# Patient Record
Sex: Male | Born: 1980 | Race: White | Hispanic: No | Marital: Single | Smoking: Current every day smoker
Health system: Southern US, Community
[De-identification: ages and names within clinical notes are randomized; demographics above are authoritative.]

## PROBLEM LIST (undated history)

## (undated) HISTORY — PX: KNEE ARTHROSCOPY: SUR90

---

## 2017-11-15 ENCOUNTER — Emergency Department (HOSPITAL_COMMUNITY)
Admission: EM | Admit: 2017-11-15 | Discharge: 2017-11-15 | Disposition: A | Discharge status: Home / Self Care | Payer: Self-pay | Attending: Emergency Medicine | Admitting: Emergency Medicine

## 2017-11-15 ENCOUNTER — Emergency Department (HOSPITAL_COMMUNITY): Payer: Self-pay

## 2017-11-15 ENCOUNTER — Encounter (HOSPITAL_COMMUNITY): Payer: Self-pay | Admitting: *Deleted

## 2017-11-15 ENCOUNTER — Other Ambulatory Visit: Payer: Self-pay

## 2017-11-15 DIAGNOSIS — R1032 Left lower quadrant pain: Secondary | ICD-10-CM | POA: Insufficient documentation

## 2017-11-15 DIAGNOSIS — I861 Scrotal varices: Secondary | ICD-10-CM | POA: Insufficient documentation

## 2017-11-15 DIAGNOSIS — F17200 Nicotine dependence, unspecified, uncomplicated: Secondary | ICD-10-CM | POA: Insufficient documentation

## 2017-11-15 LAB — CBC WITH DIFFERENTIAL/PLATELET
BASOS PCT: 1 %
Basophils Absolute: 0 10*3/uL (ref 0.0–0.1)
Eosinophils Absolute: 0.5 10*3/uL (ref 0.0–0.7)
Eosinophils Relative: 7 %
HEMATOCRIT: 41.9 % (ref 39.0–52.0)
HEMOGLOBIN: 14 g/dL (ref 13.0–17.0)
LYMPHS ABS: 2.5 10*3/uL (ref 0.7–4.0)
Lymphocytes Relative: 37 %
MCH: 29.9 pg (ref 26.0–34.0)
MCHC: 33.4 g/dL (ref 30.0–36.0)
MCV: 89.5 fL (ref 78.0–100.0)
MONOS PCT: 12 %
Monocytes Absolute: 0.8 10*3/uL (ref 0.1–1.0)
NEUTROS ABS: 3 10*3/uL (ref 1.7–7.7)
NEUTROS PCT: 43 %
Platelets: 138 10*3/uL — ABNORMAL LOW (ref 150–400)
RBC: 4.68 MIL/uL (ref 4.22–5.81)
RDW: 13.4 % (ref 11.5–15.5)
WBC: 6.8 10*3/uL (ref 4.0–10.5)

## 2017-11-15 LAB — URINALYSIS, ROUTINE W REFLEX MICROSCOPIC
Bilirubin Urine: NEGATIVE
Glucose, UA: NEGATIVE mg/dL
Hgb urine dipstick: NEGATIVE
KETONES UR: NEGATIVE mg/dL
Leukocytes, UA: NEGATIVE
Nitrite: NEGATIVE
PH: 7 (ref 5.0–8.0)
Protein, ur: NEGATIVE mg/dL
SPECIFIC GRAVITY, URINE: 1.019 (ref 1.005–1.030)
SQUAMOUS EPITHELIAL / LPF: NONE SEEN

## 2017-11-15 LAB — BASIC METABOLIC PANEL
Anion gap: 11 (ref 5–15)
BUN: 10 mg/dL (ref 6–20)
CHLORIDE: 105 mmol/L (ref 101–111)
CO2: 24 mmol/L (ref 22–32)
CREATININE: 0.96 mg/dL (ref 0.61–1.24)
Calcium: 9.5 mg/dL (ref 8.9–10.3)
GFR calc non Af Amer: >60 mL/min (ref >60)
Glucose, Bld: 80 mg/dL (ref 65–99)
Potassium: 3.8 mmol/L (ref 3.5–5.1)
Sodium: 140 mmol/L (ref 135–145)

## 2017-11-15 MED ORDER — ONDANSETRON HCL 4 MG/2ML IJ SOLN
4.0000 mg | Freq: Once | INTRAMUSCULAR | Status: AC
  Administered 2017-11-15: 4 mg via INTRAVENOUS
  Filled 2017-11-15: qty 2

## 2017-11-15 MED ORDER — MORPHINE SULFATE (PF) 4 MG/ML IV SOLN
8.0000 mg | Freq: Once | INTRAVENOUS | Status: AC
Start: 2017-11-15 — End: 2017-11-15
  Administered 2017-11-15: 8 mg via INTRAVENOUS
  Filled 2017-11-15: qty 2

## 2017-11-15 MED ORDER — KETOROLAC TROMETHAMINE 30 MG/ML IJ SOLN
30.0000 mg | Freq: Once | INTRAMUSCULAR | Status: AC
  Administered 2017-11-15: 30 mg via INTRAVENOUS
  Filled 2017-11-15: qty 1

## 2017-11-15 MED ORDER — HYDROCODONE-ACETAMINOPHEN 5-325 MG PO TABS: 1.0000 | ORAL_TABLET | Freq: Four times a day (QID) | ORAL | 0 refills | Status: AC | PRN

## 2017-11-15 MED ORDER — MORPHINE SULFATE (PF) 4 MG/ML IV SOLN: 4.0000 mg | Freq: Once | INTRAVENOUS | Status: DC

## 2017-11-15 NOTE — ED Triage Notes (Addendum)
Pt c/o left testicular pain, swelling and "drawing up" and LLQ abdomen x 3 days. Pt reports straining to urinate. Denies hematuria, vomiting. Reports nausea last night, none at this time. Pt also reports pain radiating to left flank.

## 2017-11-15 NOTE — ED Provider Notes (Signed)
[facility] EMERGENCY DEPARTMENT Provider Note   CSN: [id number] Arrival date & time: 11/15/17  1653     History   Chief Complaint Chief Complaint  Patient presents with  . Testicle Pain    HPI Troy PerfectJonathan Murphy is a 36 y.o. male.  The history is provided by the patient.  Testicle Pain  This is a new problem. The current episode started more than 2 days ago. The problem occurs hourly. The problem has been gradually worsening. Associated symptoms include abdominal pain. Nothing aggravates the symptoms. Relieved by: pushing left testicle upwards. He has tried nothing for the symptoms.   36 year old male who presents with 3 days of left-sided testicular pain followed by left lower quadrant and left flank pain.  Denies any nausea, vomiting, dysuria or urinary frequency, or hematuria.  No fevers or chills.  No diarrhea.  Has not had similar symptoms in the past.  Does feel that his left testicle is riding higher than the right.  Initially had some mild swelling the first day of the left testicle, but now resolved.  History reviewed. No pertinent past medical history.  There are no active problems to display for this patient.   Past Surgical History:  Procedure Laterality Date  . KNEE ARTHROSCOPY Bilateral        Home Medications    Prior to Admission medications   Medication Sig Start Date End Date Taking? Authorizing Provider  HYDROcodone-acetaminophen (NORCO/VICODIN) 5-325 MG tablet Take 1 tablet by mouth every 6 (six) hours as needed. 11/15/17   Lavera GuiseLiu, Jacquline Terrill Duo, MD    Family History No family history on file.  Social History Social History   Tobacco Use  . Smoking status: Current Every Day Smoker  . Smokeless tobacco: Never Used  Substance Use Topics  . Alcohol use: No    Frequency: Never  . 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   Review of Systems Review of Systems  Constitutional: Negative  for fever.  Gastrointestinal: Positive for abdominal pain.  Genitourinary: Positive for difficulty urinating, flank pain, scrotal swelling and testicular pain. Negative for dysuria and frequency.  All other systems reviewed and are negative.    Physical Exam Updated Vital Signs BP (!) 142/106 (BP Location: Right Arm)   Pulse (!) 132   Temp 98.5 F (36.9 C) (Oral)   Resp 19   Ht 6' (1.829 m)   Wt 79.4 kg (175 lb)   SpO2 94%   BMI 23.73 kg/m   Physical Exam Physical Exam  Nursing note and vitals reviewed. Constitutional: Well developed, well nourished, non-toxic, and in no acute distress Head: Normocephalic and atraumatic.  Mouth/Throat: Oropharynx is clear and moist.  Neck: Normal range of motion. Neck supple.  Cardiovascular: Normal rate and regular rhythm.   Pulmonary/Chest: Effort normal and breath sounds normal.  Abdominal: Soft. There is no rebound and no guarding. Mild left CVA and left lower quadrant tenderness GU: Difficult exam due to poor patient cooperation. No erythema, warmth or swelling of scrotum or testicles. No palpable masses. Tenderness of the left epididymis. Normal testicular lie but left testicle sits higher than the right.  Normal cremasteric reflexes Musculoskeletal: Normal range of motion.  Neurological: Alert, no facial droop, fluent speech, moves all extremities symmetrically Skin: Skin is warm and dry.  Psychiatric: Cooperative   ED Treatments / Results  Labs (all labs ordered are listed, but only abnormal results are  displayed) Labs Reviewed  URINALYSIS, ROUTINE W REFLEX MICROSCOPIC - Abnormal; Notable for the following components:      Result Value   Bacteria, UA RARE (*)    All other components within normal limits  CBC WITH DIFFERENTIAL/PLATELET - Abnormal; Notable for the following components:   Platelets 138 (*)    All other components within normal limits  URINE CULTURE  BASIC METABOLIC PANEL  GC/CHLAMYDIA PROBE AMP ([place]) NOT  [facility]    EKG  EKG Interpretation None       Radiology [place] Scrotum  Result Date: 11/15/2017 CLINICAL DATA:  Three day history of left testicular pain and swelling. EXAM: ULTRASOUND OF SCROTUM TECHNIQUE: Complete ultrasound examination of the testicles, epididymis, and other scrotal structures was performed. COMPARISON:  None. FINDINGS: Right testicle Measurements: 4.4 x 3.3 x 2.5 cm. No mass or microlithiasis visualized. Left testicle Measurements: 4.7 x 2.3 x 3.0 cm. No mass or microlithiasis visualized. Right epididymis:  Normal in size and appearance. Left epididymis:  Tiny spermatocele or epididymal cyst. Hydrocele:  None visualized. Varicocele: Venous channels in the left hemiscrotum are distended up to 5-6 mm, compatible with varicocele. IMPRESSION: 1. No evidence for testicular torsion. 2. Left-sided varicocele. Electronically Signed   By: Kennith CenterEric  Mansell M.D.   On: 11/15/2017 18:04   [place] Art/ven Flow Abd Pelv Doppler  Result Date: 11/15/2017 CLINICAL DATA:  Three day history of left testicular pain and swelling. EXAM: ULTRASOUND OF SCROTUM TECHNIQUE: Complete ultrasound examination of the testicles, epididymis, and other scrotal structures was performed. COMPARISON:  None. FINDINGS: Right testicle Measurements: 4.4 x 3.3 x 2.5 cm. No mass or microlithiasis visualized. Left testicle Measurements: 4.7 x 2.3 x 3.0 cm. No mass or microlithiasis visualized. Right epididymis:  Normal in size and appearance. Left epididymis:  Tiny spermatocele or epididymal cyst. Hydrocele:  None visualized. Varicocele: Venous channels in the left hemiscrotum are distended up to 5-6 mm, compatible with varicocele. IMPRESSION: 1. No evidence for testicular torsion. 2. Left-sided varicocele. Electronically Signed   By: Kennith CenterEric  Mansell M.D.   On: 11/15/2017 18:04   Ct Renal Stone Study  Result Date: 11/15/2017 CLINICAL DATA:  Testicular pain left-sided and left lower quadrant pain EXAM: CT ABDOMEN AND PELVIS WITHOUT  CONTRAST TECHNIQUE: Multidetector CT imaging of the abdomen and pelvis was performed following the standard protocol without IV contrast. COMPARISON:  Ultrasound 11/15/2017 FINDINGS: Lower chest: No acute abnormality. Hepatobiliary: No focal liver abnormality is seen. No gallstones, gallbladder wall thickening, or biliary dilatation. Pancreas: Unremarkable. No pancreatic ductal dilatation or surrounding inflammatory changes. Spleen: Normal in size without focal abnormality. Adrenals/Urinary Tract: Right adrenal gland is normal. 2 cm fat density left adrenal mass. Kidneys show no hydronephrosis. 4 mm stone mid to upper pole left kidney. No ureteral stone. Bladder unremarkable Stomach/Bowel: Stomach is within normal limits. Appendix appears normal. No evidence of bowel wall thickening, distention, or inflammatory changes. Vascular/Lymphatic: No significant vascular findings are present. No enlarged abdominal or pelvic lymph nodes. Reproductive: Prostate is unremarkable. Other: Negative for free air or free fluid. Small fat in the umbilicus. Musculoskeletal: No acute or significant osseous findings. IMPRESSION: 1. Negative for hydronephrosis or ureteral stone. 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   On: 11/15/2017 19:16    Procedures Procedures (including critical care time)  Medications Ordered in ED Medications  ketorolac (TORADOL) 30 MG/ML injection 30 mg (30 mg Intravenous Given 11/15/17  1825)  ondansetron (ZOFRAN) injection 4 mg (4 mg Intravenous Given 11/15/17 1825)  morphine 4 MG/ML injection 8 mg (8 mg Intravenous Given 11/15/17 2038)     Initial Impression / Assessment and Plan / ED Course  I have reviewed the triage vital signs and the nursing notes.  Pertinent labs & imaging results that were available during my care of the patient were reviewed by me and considered in my medical decision making (see  chart for details).     Presenting with left lower quadrant, left testicular, left flank pain.  Patient is nontoxic but does appear uncomfortable secondary to pain.  Soft and nonsurgical abdomen.  Differential does include potential testicular/scrotal pathology such as epididymitis versus torsion.  Differential also includes potential renal stone disease.  Ultrasound was performed with Doppler of the scrotum.  This is visualized.  No evidence of torsion, mass , or inflammation.  There is evidence of large varicocele.  This could be causing some of his symptoms.  Renal stone CT was also performed to rule out nephrolithiasis/urolithiasis.  He has nonobstructing 4 mm kidney stone, but no other acute intra-abdominal pathology.  Patient did receive pain control, and feels improved.  UA is unremarkable.  Will discharge home with urology follow-up.  [place] CSRS reviewed. No previous record.  Strict return and follow-up instructions reviewed. He expressed understanding of all discharge instructions and felt comfortable with the plan of care.   Final Clinical Impressions(s) / ED Diagnoses   Final diagnoses:  Varicocele    ED Discharge Orders        Ordered    HYDROcodone-acetaminophen (NORCO/VICODIN) 5-325 MG tablet  Every 6 hours PRN     11/15/17 2304       Lavera Guise, MD 11/15/17 2309

## 2017-11-15 NOTE — Discharge Instructions (Addendum)
Please follow-up with urology if you having ongoing pain with your varicocele. Continue ibuprofen for pain and take Norco for breakthrough pain. Return for worsening symptoms, including fever, escalating pain, intractable vomiting or any other symptoms concerning to you.  You did receive morphine for pain control today in the ED

## 2017-11-15 NOTE — ED Notes (Signed)
EDP notified of pt's symptoms 

## 2017-11-17 LAB — URINE CULTURE: Culture: <10000 — AB

## 2019-03-05 IMAGING — CT CT RENAL STONE PROTOCOL
2 of 4 series · 16 of 46 positions shown, 18 images · non-contrast
Comparison: Ultrasound 11/15/2017

CLINICAL DATA: Testicular pain left-sided and left lower quadrant
pain

EXAM:
CT ABDOMEN AND PELVIS WITHOUT CONTRAST
TECHNIQUE: Multidetector CT imaging of the abdomen and pelvis was performed
following the standard protocol without IV contrast.

[Series 2: axial st · axial · 0.76mm/px · z∈[-612,-132]mm · 13 of 106 slices shown, 15 images]
[im 5/106  soft-tissue]
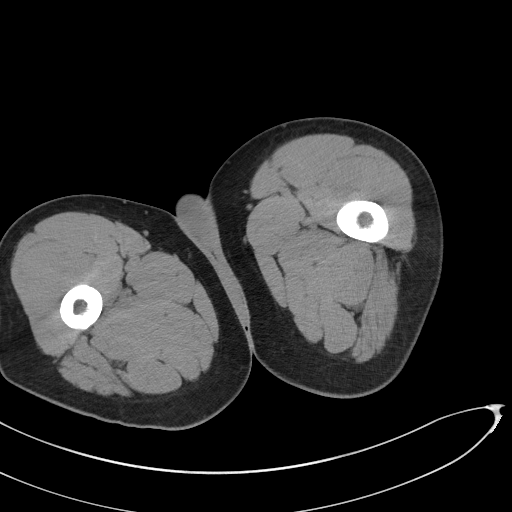
[im 5/106  bone]
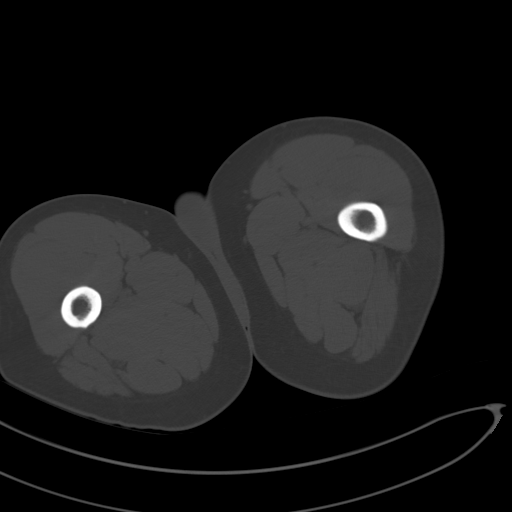
[im 14/106  soft-tissue]
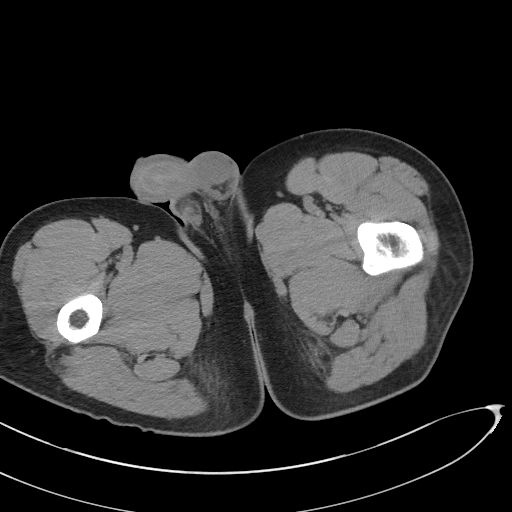
[im 22/106  soft-tissue]
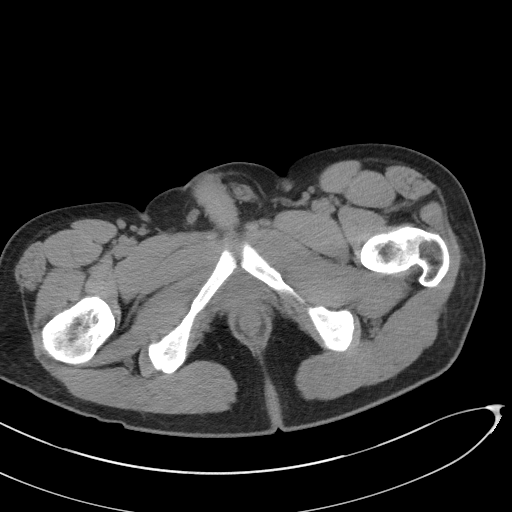
[im 31/106  soft-tissue]
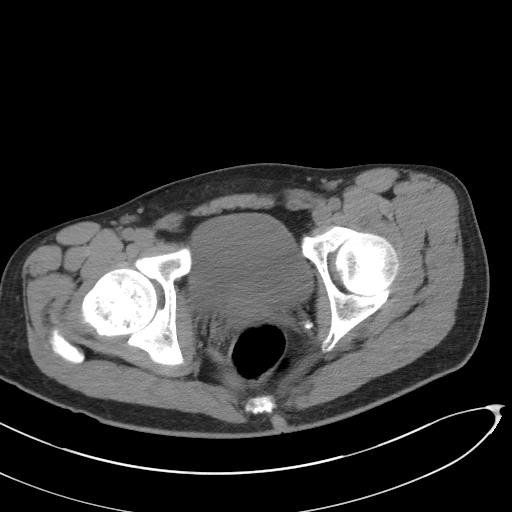
[im 36/106  soft-tissue]
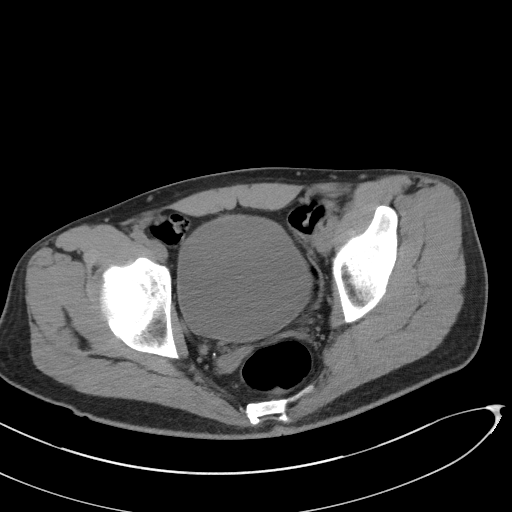
[im 44/106  soft-tissue]
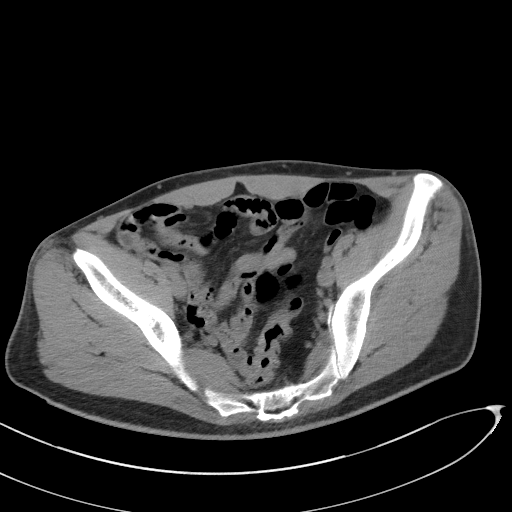
[im 53/106  soft-tissue]
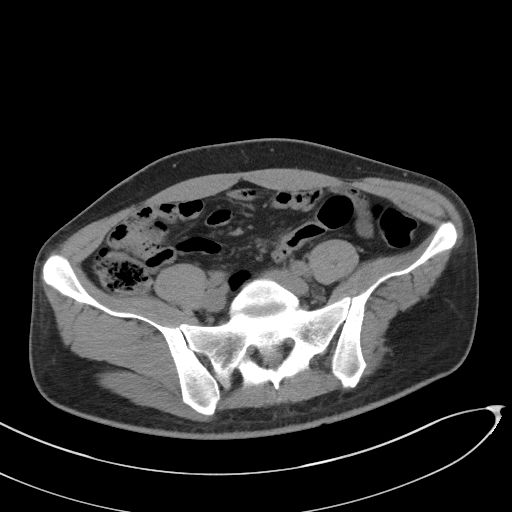
[im 62/106  soft-tissue]
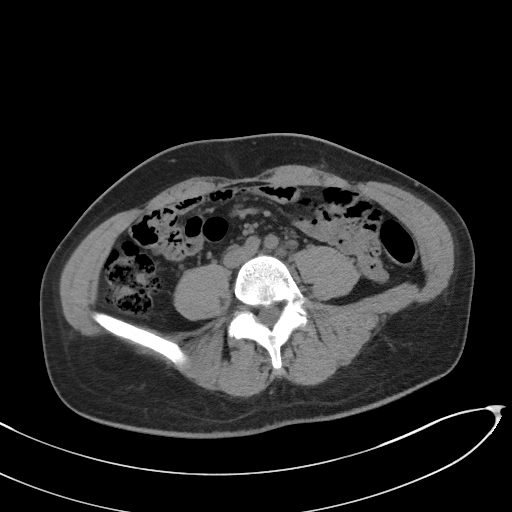
[im 71/106  soft-tissue]
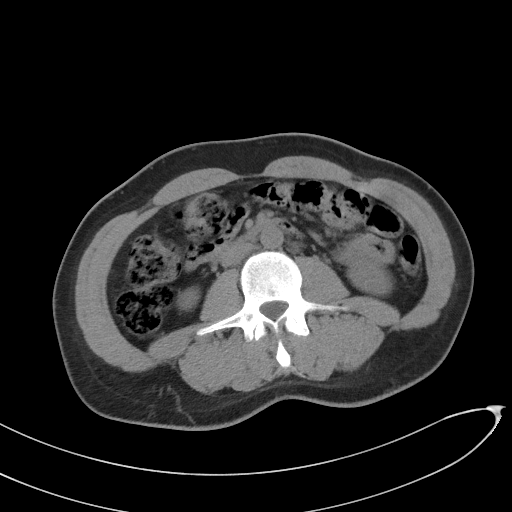
[im 71/106  bone]
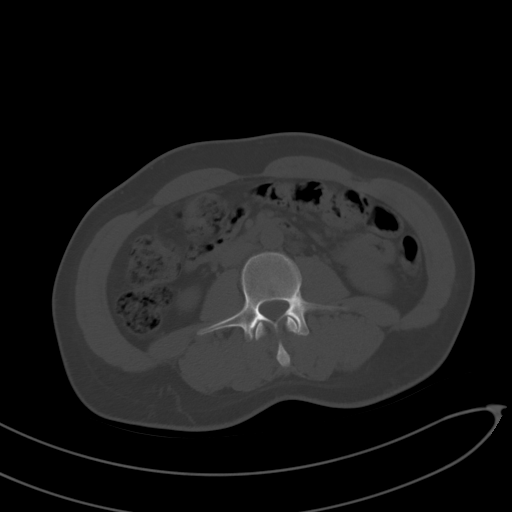
[im 75/106  soft-tissue]
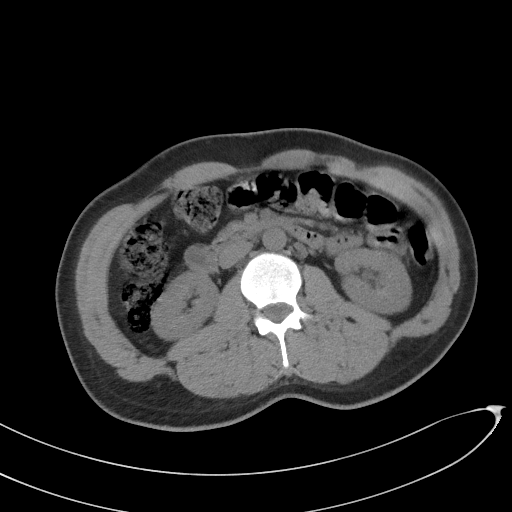
[im 84/106  soft-tissue]
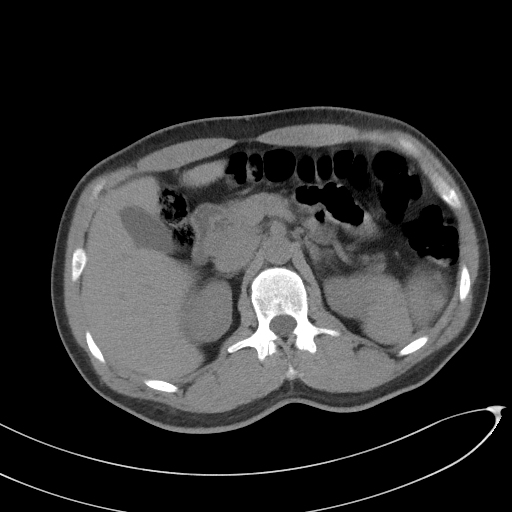
[im 92/106  soft-tissue]
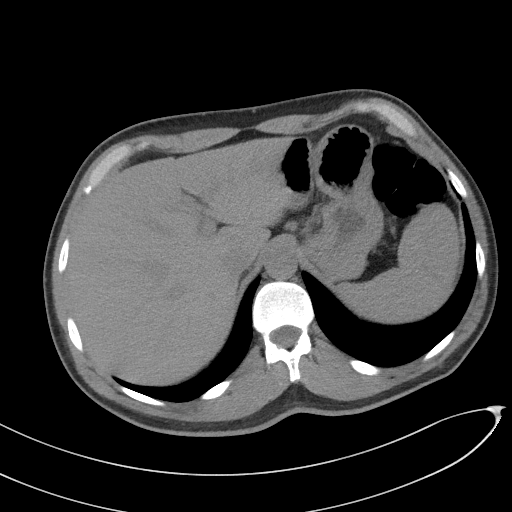
[im 101/106  soft-tissue]
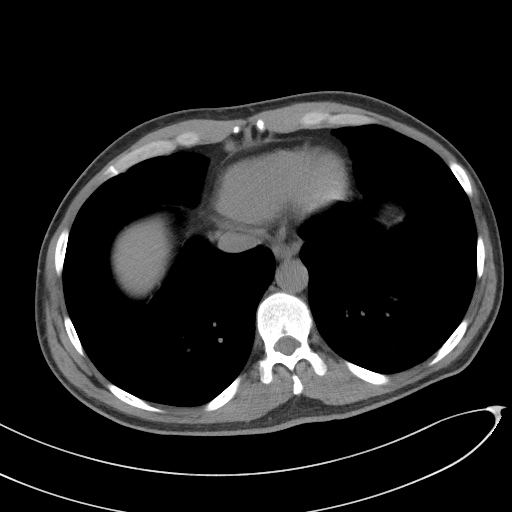

[Series 5: coronal st · coronal · 0.75mm/px · 3 of 82 slices shown]
[im 28/82  soft-tissue]
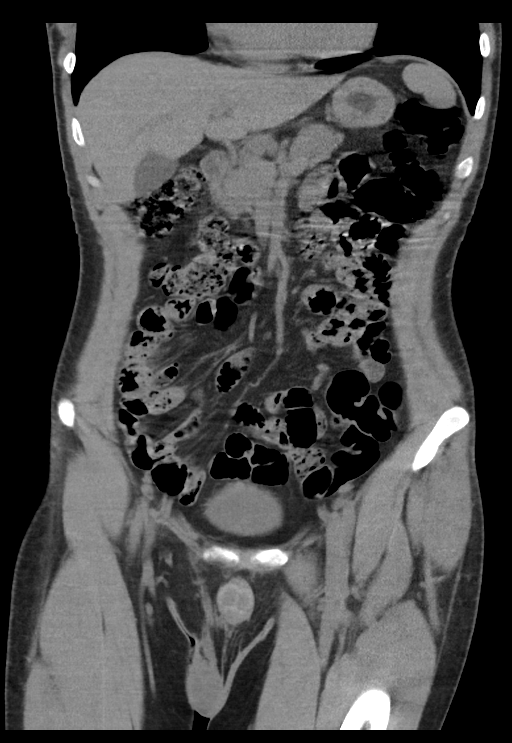
[im 37/82  soft-tissue]
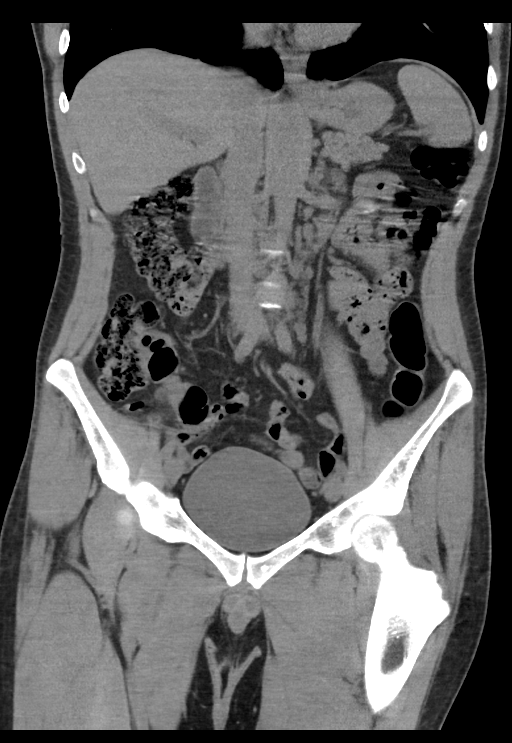
[im 46/82  soft-tissue]
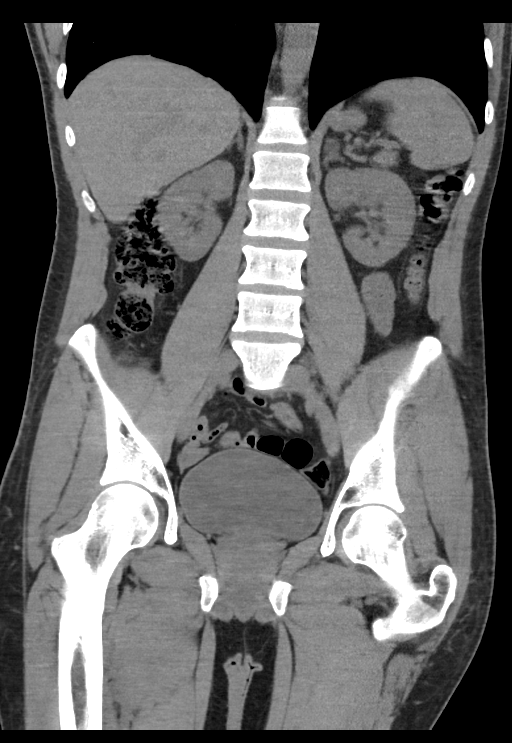

[16 of 46 positions shown; findings below may reference images not displayed]

FINDINGS: Lower chest: No acute abnormality.

Hepatobiliary: No focal liver abnormality is seen. No gallstones,
gallbladder wall thickening, or biliary dilatation.

Pancreas: Unremarkable. No pancreatic ductal dilatation or
surrounding inflammatory changes.

Spleen: Normal in size without focal abnormality.

Adrenals/Urinary Tract: Right adrenal gland is normal. 2 cm fat
density left adrenal mass. Kidneys show no hydronephrosis. 4 mm
stone mid to upper pole left kidney. No ureteral stone. Bladder
unremarkable

Stomach/Bowel: Stomach is within normal limits. Appendix appears
normal. No evidence of bowel wall thickening, distention, or
inflammatory changes.

Vascular/Lymphatic: No significant vascular findings are present. No
enlarged abdominal or pelvic lymph nodes.

Reproductive: Prostate is unremarkable.

Other: Negative for free air or free fluid. Small fat in the
umbilicus.

Musculoskeletal: No acute or significant osseous findings.
IMPRESSION: 1. Negative for hydronephrosis or ureteral stone. 4 mm
nonobstructing stone in the left kidney
2. 2 cm left adrenal gland myelolipoma

## 2019-05-14 IMAGING — US US ART/VEN ABD/PELV/SCROTUM DOPPLER LTD
1 series · 14 of 25 positions shown · non-contrast
Comparison: None.

CLINICAL DATA: Three day history of left testicular pain and
swelling.

EXAM:
ULTRASOUND OF SCROTUM
TECHNIQUE: Complete ultrasound examination of the testicles, epididymis, and
other scrotal structures was performed.

[Series 1: us art/ven abd/pelv/scrotum doppler ltd · 0.06mm/px · 14 of 70 slices shown]
[im 1/70]
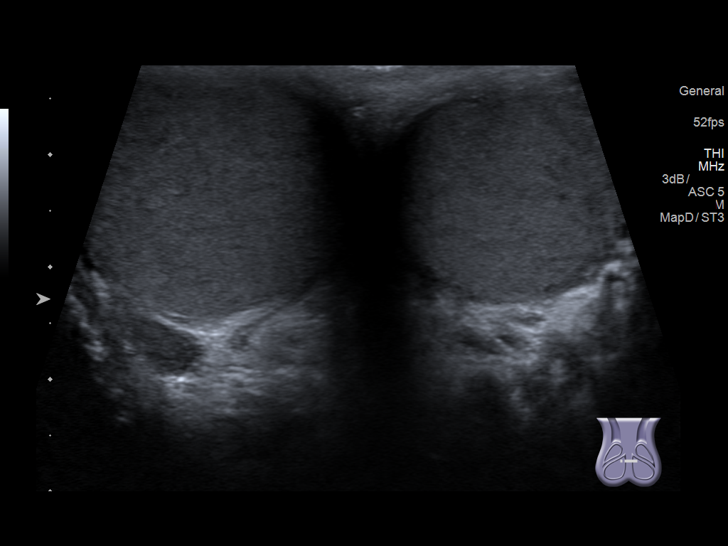
[im 6/70]
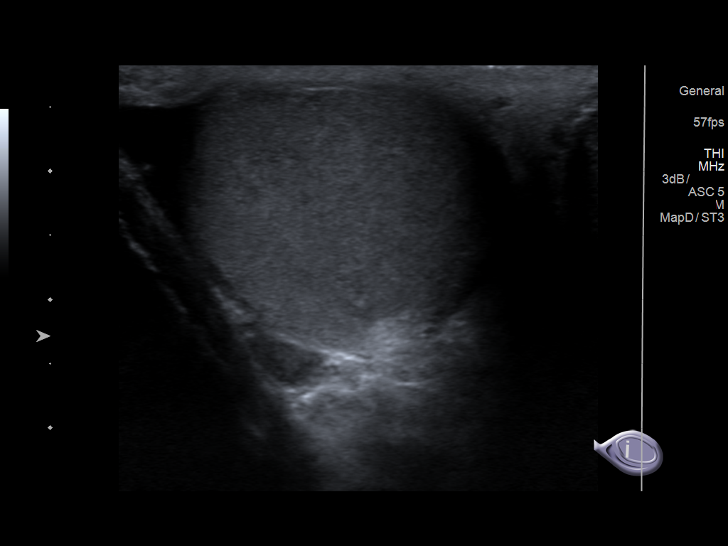
[im 12/70]
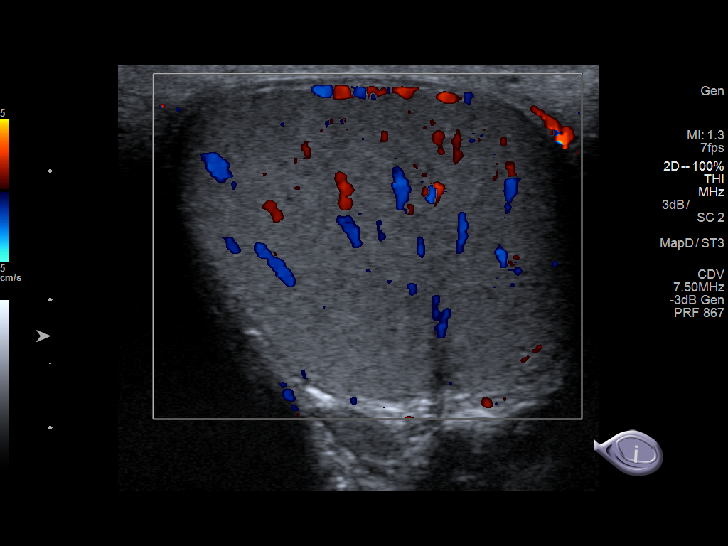
[im 18/70]
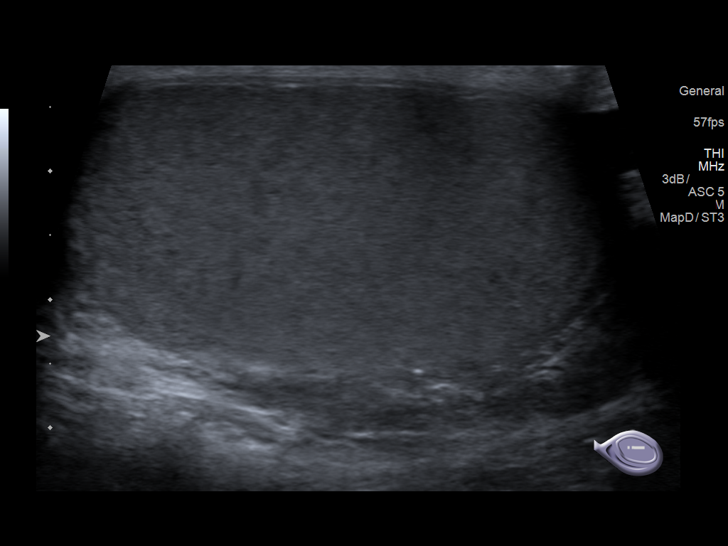
[im 24/70]
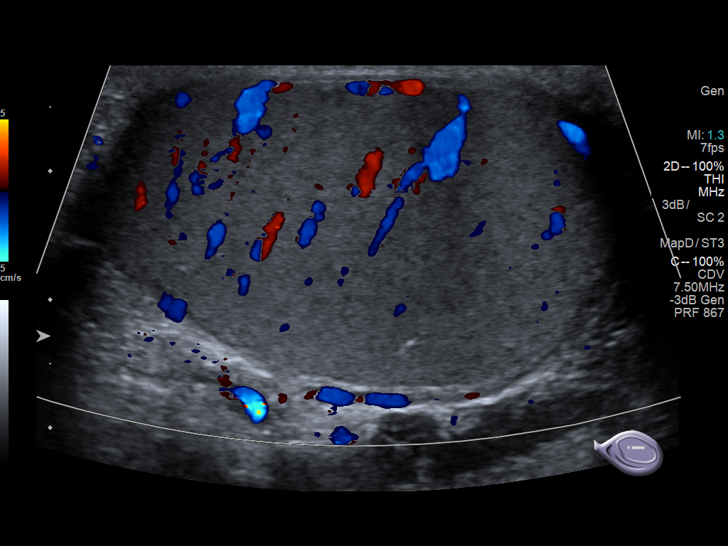
[im 26/70]
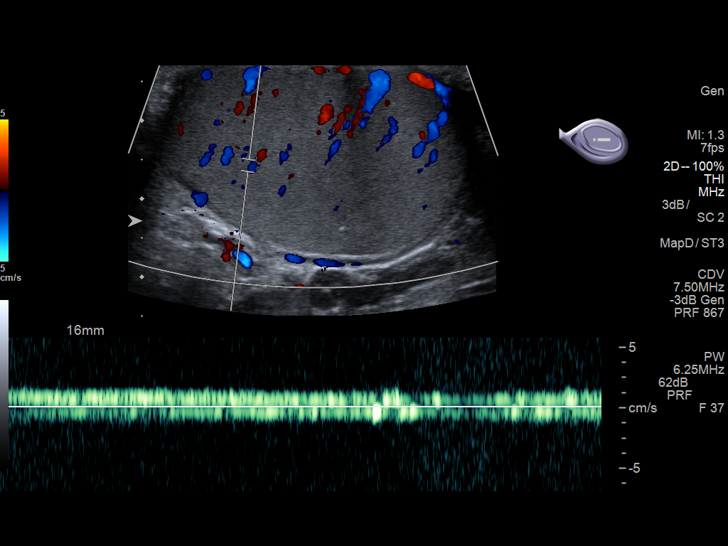
[im 32/70]
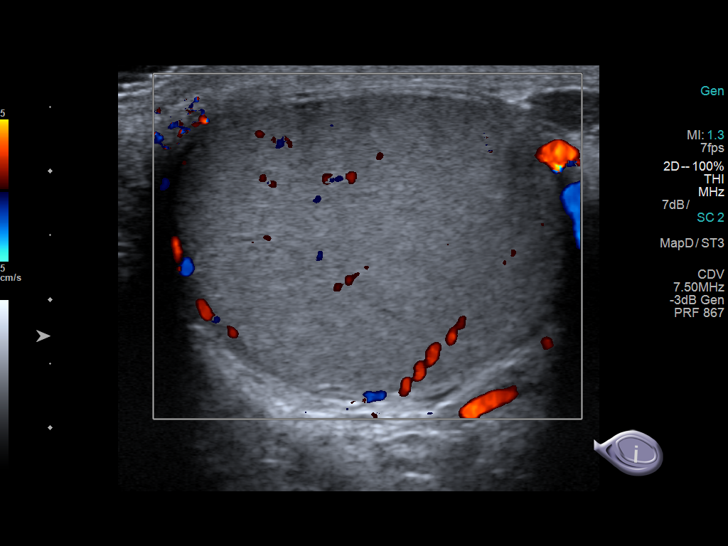
[im 38/70]
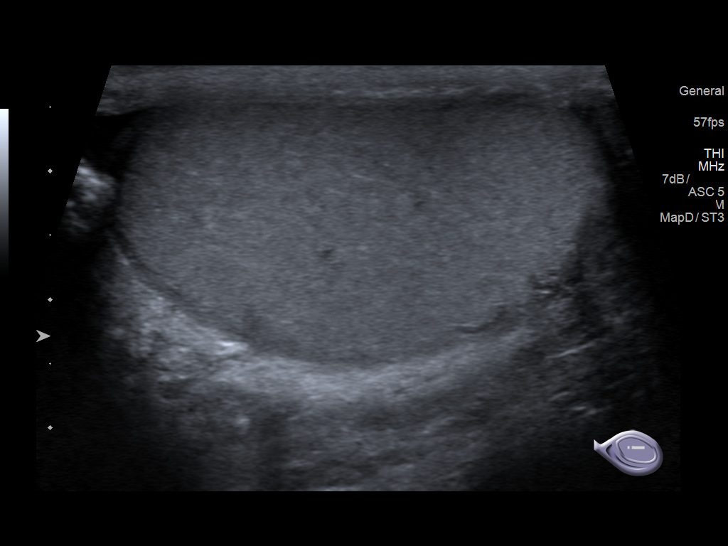
[im 44/70]
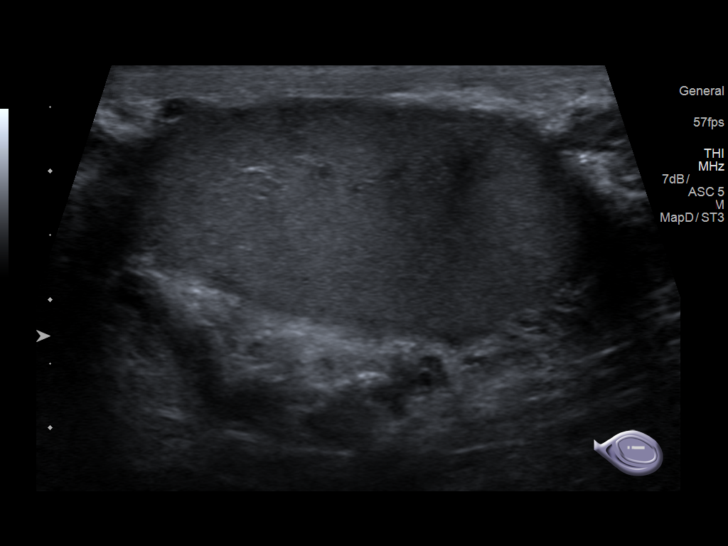
[im 47/70]
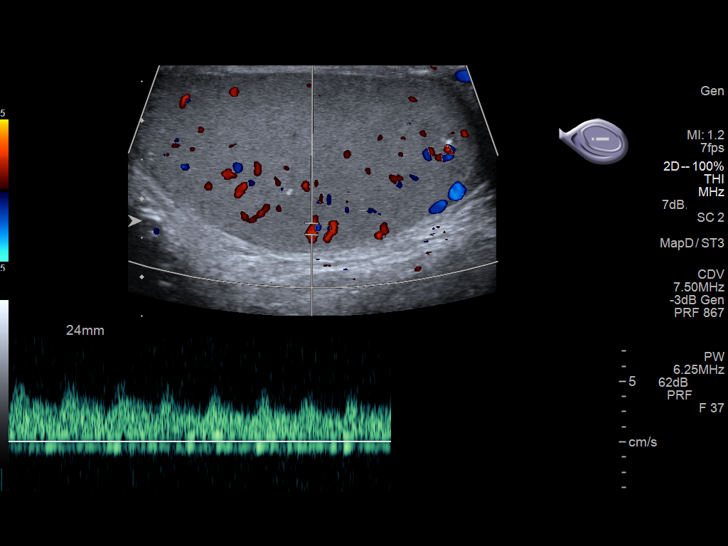
[im 52/70]
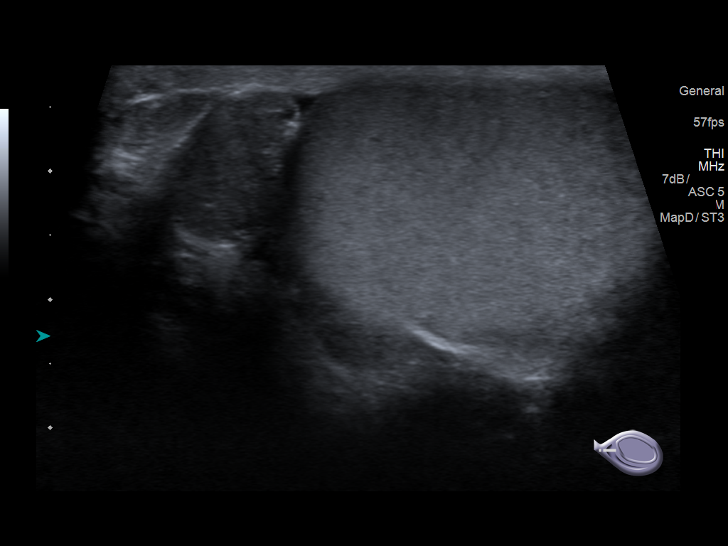
[im 58/70]
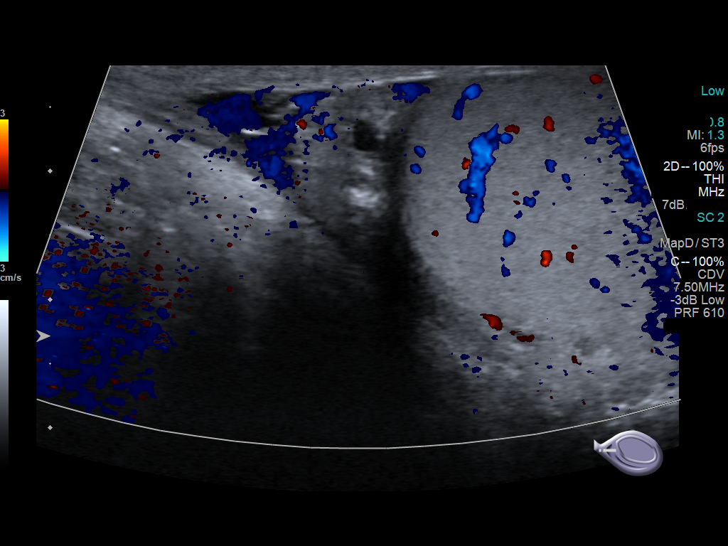
[im 64/70]
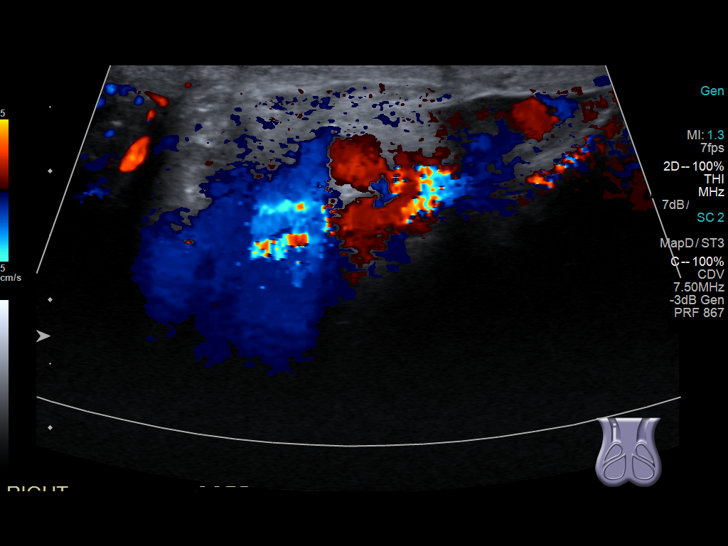
[im 70/70]
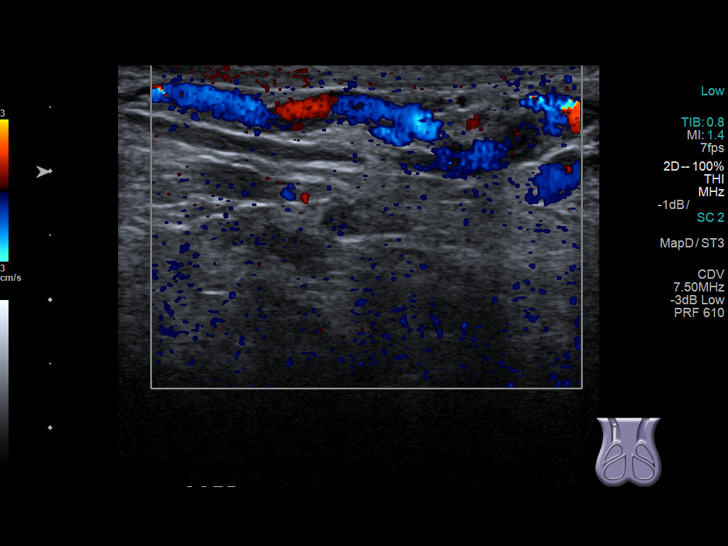

[14 of 25 positions shown; findings below may reference images not displayed]

FINDINGS: Right testicle

Measurements: 4.4 x 3.3 x 2.5 cm. No mass or microlithiasis
visualized.

Left testicle

Measurements: 4.7 x 2.3 x 3.0 cm. No mass or microlithiasis
visualized.

Right epididymis:  Normal in size and appearance.

Left epididymis:  Tiny spermatocele or epididymal cyst.

Hydrocele:  None visualized.

Varicocele: Venous channels in the left hemiscrotum are distended up
to 5-6 mm, compatible with varicocele.
IMPRESSION: 1. No evidence for testicular torsion.
2. Left-sided varicocele.

## 2022-11-30 DEATH — deceased
